# Patient Record
Sex: Male | Born: 1995 | Race: White | Hispanic: Yes | Marital: Single | Smoking: Never smoker
Health system: Southern US, Community
[De-identification: ages and names within clinical notes are randomized; demographics above are authoritative.]

## PROBLEM LIST (undated history)

## (undated) HISTORY — PX: HERNIA REPAIR: SHX51

---
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
## 2019-09-10 ENCOUNTER — Encounter (HOSPITAL_COMMUNITY): Payer: Self-pay | Admitting: Student

## 2019-09-10 ENCOUNTER — Other Ambulatory Visit: Payer: Self-pay

## 2019-09-10 ENCOUNTER — Emergency Department (HOSPITAL_COMMUNITY)
Admission: EM | Admit: 2019-09-10 | Discharge: 2019-09-10 | Disposition: A | Discharge status: Home / Self Care | Payer: Medicaid Other | Attending: Emergency Medicine | Admitting: Emergency Medicine

## 2019-09-10 ENCOUNTER — Emergency Department (HOSPITAL_COMMUNITY): Payer: Medicaid Other

## 2019-09-10 DIAGNOSIS — R112 Nausea with vomiting, unspecified: Secondary | ICD-10-CM | POA: Diagnosis not present

## 2019-09-10 DIAGNOSIS — R109 Unspecified abdominal pain: Secondary | ICD-10-CM

## 2019-09-10 DIAGNOSIS — R197 Diarrhea, unspecified: Secondary | ICD-10-CM | POA: Diagnosis not present

## 2019-09-10 DIAGNOSIS — R1013 Epigastric pain: Secondary | ICD-10-CM | POA: Diagnosis not present

## 2019-09-10 LAB — CBC
HCT: 45 % (ref 39.0–52.0)
Hemoglobin: 15.3 g/dL (ref 13.0–17.0)
MCH: 30.8 pg (ref 26.0–34.0)
MCHC: 34 g/dL (ref 30.0–36.0)
MCV: 90.7 fL (ref 80.0–100.0)
Platelets: 261 10*3/uL (ref 150–400)
RBC: 4.96 MIL/uL (ref 4.22–5.81)
RDW: 11.9 % (ref 11.5–15.5)
WBC: 6.2 10*3/uL (ref 4.0–10.5)
nRBC: 0 % (ref 0.0–0.2)

## 2019-09-10 LAB — COMPREHENSIVE METABOLIC PANEL
ALT: 20 U/L (ref 0–44)
AST: 17 U/L (ref 15–41)
Albumin: 4.2 g/dL (ref 3.5–5.0)
Alkaline Phosphatase: 67 U/L (ref 38–126)
Anion gap: 8 (ref 5–15)
BUN: 16 mg/dL (ref 6–20)
CO2: 28 mmol/L (ref 22–32)
Calcium: 9.4 mg/dL (ref 8.9–10.3)
Chloride: 105 mmol/L (ref 98–111)
Creatinine, Ser: 0.85 mg/dL (ref 0.61–1.24)
GFR calc Af Amer: >60 mL/min (ref >60)
GFR calc non Af Amer: >60 mL/min (ref >60)
Glucose, Bld: 100 mg/dL — ABNORMAL HIGH (ref 70–99)
Potassium: 4.6 mmol/L (ref 3.5–5.1)
Sodium: 141 mmol/L (ref 135–145)
Total Bilirubin: 1.1 mg/dL (ref 0.3–1.2)
Total Protein: 7.1 g/dL (ref 6.5–8.1)

## 2019-09-10 LAB — LIPASE, BLOOD: Lipase: 22 U/L (ref 11–51)

## 2019-09-10 MED ORDER — ONDANSETRON 4 MG PO TBDP
4.0000 mg | ORAL_TABLET | Freq: Once | ORAL | Status: AC
  Administered 2019-09-10: 17:00:00 4 mg via ORAL
  Filled 2019-09-10: qty 1

## 2019-09-10 MED ORDER — FAMOTIDINE 20 MG PO TABS: 20.0000 mg | ORAL_TABLET | Freq: Two times a day (BID) | ORAL | 0 refills | Status: AC

## 2019-09-10 MED ORDER — ONDANSETRON 4 MG PO TBDP: 4.0000 mg | ORAL_TABLET | Freq: Three times a day (TID) | ORAL | 0 refills | Status: AC | PRN

## 2019-09-10 MED ORDER — SUCRALFATE 1 GM/10ML PO SUSP: 1.0000 g | Freq: Three times a day (TID) | ORAL | 0 refills | Status: AC

## 2019-09-10 NOTE — ED Notes (Signed)
Pt. Ambulated to the bathroom

## 2019-09-10 NOTE — ED Notes (Signed)
Patient transported to US 

## 2019-09-10 NOTE — Discharge Instructions (Addendum)
You were seen in the emergency department today for nausea, vomiting, diarrhea, abdominal pain.  Your work-up was overall reassuring.  Your labs and ultrasound were normal.  This is potentially a viral GI illness.  We are sending you home with Zofran to take every 8 hours as needed for nausea and vomiting and Pepcid to take twice daily as needed for stomach upset as well as Carafate to take prior to meals and prior to bedtime.   We have prescribed you new medication(s) today. Discuss the medications prescribed today with your pharmacist as they can have adverse effects and interactions with your other medicines including over the counter and prescribed medications. Seek medical evaluation if you start to experience new or abnormal symptoms after taking one of these medicines, seek care immediately if you start to experience difficulty breathing, feeling of your throat closing, facial swelling, or rash as these could be indications of a more serious allergic reaction  Follow attached diet guidelines.   Follow-up with primary care within 3 days.  Return to the emergency department for new or worsening symptoms including but not limited to increased pain, blood in vomit, blood in stool, fever, or any other concerns.

## 2019-09-10 NOTE — ED Triage Notes (Signed)
Pt here for upper abdominal pain with n/v since Sunday. Pt denies sick contacts. 6 episodes of vomiting total.

## 2019-09-10 NOTE — ED Provider Notes (Signed)
MSE was initiated and I personally evaluated the patient  2:59 PM on September 10, 2019.  Patient placed in Quick Look pathway, seen and evaluated   Chief Complaint: abdominal pain, N/V  HPI:   24 yo male s/p appendectomy presents to the ED with complaints of abdominal pain & N/V/D x 3 days. 5-6 episodes of emesis, 2 episodes of loose stool- no blood in either. Intermittent upper abdominal pain, worse with eating & emesis, no alleviating factors. Unable to tolerate PO. No recent travel. No covid sick contacts. No uri sxs.   ROS: Negative for fever/chills.   Physical Exam:   Gen: No distress  Neuro: Awake and Alert  Skin: Warm    Focused Exam: Abdomen, upper abdominal tenderness most prominent in the epigastric area, RUQ > LUQ. No peritoneal signs.    Initiation of care has begun. The patient has been counseled on the process, plan, and necessity for staying for the completion/evaluation, and the remainder of the medical screening examination. I discussed with patient  and if present family/friends the importance of alerting staff to any new or worsening symptoms or any other concerns throughout his/her ER visit.    Desmond Lope 09/10/19 1501    Gerhard Munch, MD 09/11/19 [phone]

## 2019-09-10 NOTE — ED Notes (Signed)
Pt. Given some saltines and a water.

## 2019-09-10 NOTE — ED Provider Notes (Signed)
[facility] EMERGENCY DEPARTMENT Provider Note   CSN: [id number] Arrival date & time: 09/10/19  1341     History Chief Complaint  Patient presents with  . Abdominal Pain  . Emesis    Anthony Peters is a 24 y.o. male with a hx of appendectomy who presents to the ED with complaints of abdominal pain & N/V/D x 3 days. Patient reports he has had a total of about 5-6 episodes of emesis & 2 episodes of loose stools, there has not been blood present in either stool or emesis. He reports associated intermittent abdominal pain, primarily to the epigastrium, worse with trying to eat but primarily worse with emesis. No alleviating factors. He further elaborates he has been able to keep down PO fluids, just not food- attempts to eat food lead to emesis. He ate some tacos prior to onset of sxs. No specific suspicious PO intake. No recent abx. No recent foreign travel. Denies fever, chills, hematemesis, melena, hematochezia, dysuria, testicular pain/swelling, URI sxs, cough, dyspnea, or COVID exposure.   HPI     No past medical history on file.  There are no problems to display for this patient.   Past Surgical History:  Procedure Laterality Date  . HERNIA REPAIR         No family history on file.  Social History   Tobacco Use  . Smoking status: Never Smoker  Substance Use Topics  . Alcohol use: Not on file  . Drug use: Not on file    Home Medications Prior to Admission medications   Not on File    Allergies    Patient has no known allergies.  Review of Systems   Review of Systems  Constitutional: Negative for chills and fever.  HENT: Negative for congestion, ear pain and sore throat.   Respiratory: Negative for shortness of breath.   Cardiovascular: Negative for chest pain.  Gastrointestinal: Positive for abdominal pain, diarrhea, nausea and vomiting. Negative for anal bleeding, blood in stool and constipation.  Genitourinary: Negative for  dysuria, scrotal swelling and testicular pain.  Neurological: Negative for syncope.  All other systems reviewed and are negative.   Physical Exam Updated Vital Signs BP 137/87 (BP Location: Right Arm)   Pulse 64   Temp 98.1 F (36.7 C) (Oral)   Resp 16   SpO2 100%   Physical Exam Vitals and nursing note reviewed.  Constitutional:      General: He is not in acute distress.    Appearance: He is well-developed. He is not toxic-appearing.  HENT:     Head: Normocephalic and atraumatic.     Mouth/Throat:     Mouth: Mucous membranes are moist.  Eyes:     General:        Right eye: No discharge.        Left eye: No discharge.     Conjunctiva/sclera: Conjunctivae normal.  Cardiovascular:     Rate and Rhythm: Normal rate and regular rhythm.  Pulmonary:     Effort: Pulmonary effort is normal. No respiratory distress.     Breath sounds: Normal breath sounds. No wheezing, rhonchi or rales.  Abdominal:     General: There is no distension.     Palpations: Abdomen is soft.     Tenderness: There is abdominal tenderness (upper abdomen, most prominent in the epigastric area, mild to RUQ/LUQ_ (R>L)). There is no guarding or rebound. Negative signs include Murphy's sign.  Musculoskeletal:     Cervical back: Neck supple.  Skin:    General: Skin is warm and dry.     Findings: No rash.  Neurological:     Mental Status: He is alert.     Comments: Clear speech.   Psychiatric:        Behavior: Behavior normal.     ED Results / Procedures / Treatments   Labs (all labs ordered are listed, but only abnormal results are displayed) Labs Reviewed  COMPREHENSIVE METABOLIC PANEL - Abnormal; Notable for the following components:      Result Value   Glucose, Bld 100 (*)    All other components within normal limits  LIPASE, BLOOD  CBC    EKG None  Radiology US Abdomen Limited RUQ  Result Date: 09/10/2019 CLINICAL DATA:  Epigastric region pain EXAM: ULTRASOUND ABDOMEN LIMITED RIGHT UPPER  QUADRANT COMPARISON:  None. FINDINGS: Gallbladder: No gallstones or wall thickening visualized. There is no pericholecystic fluid. No sonographic Murphy sign noted by sonographer. Common bile duct: Diameter: 4 mm. No intrahepatic or extrahepatic biliary duct dilatation. Liver: No focal lesion identified. Within normal limits in parenchymal echogenicity. Portal vein is patent on color Doppler imaging with normal direction of blood flow towards the liver. Other: None. IMPRESSION: Study within normal limits. Electronically Signed   By: Lowella Grip III M.D.   On: 09/10/2019 15:43    Procedures Procedures (including critical care time)  Medications Ordered in ED Medications - No data to display  ED Course  I have reviewed the triage vital signs and the nursing notes.  Pertinent labs & imaging results that were available during my care of the patient were reviewed by me and considered in my medical decision making (see chart for details).    MDM Rules/Calculators/A&P                      Patient presents to the ED with complaints of N/V/D & abdominal pain. Patient nontoxic appearing, in no apparent distress, vitals WNL On exam patient tender to the upper abdomen, most focally in the epigastric area, no peritoneal signs.  ER work-up reviewed:  CBC: No leukocytosis/anemia.  CMP: No electrolyte derangement. LFTs & renal function WNL Lipase: WNL Imaging: RUQ [place]: Study within normal limits.   On repeat abdominal exam patient remains without peritoneal signs, doubt cholecystitis, pancreatitis, diverticulitis, appendicitis (s/p appendectomy), bowel obstruction/perforation. Doubt GI bleed without hematemesis/melena/hematochezia, anemia & BUN WNL. Patient tolerating PO in the emergency department. Will discharge home with supportive measures. I discussed results, treatment plan, need for PCP follow-up, and return precautions with the patient. Provided opportunity for questions, patient confirmed  understanding and is in agreement with plan.    Final Clinical Impression(s) / ED Diagnoses Final diagnoses:  Abdominal pain  Nausea vomiting and diarrhea    Rx / DC Orders ED Discharge Orders         Ordered    ondansetron (ZOFRAN ODT) 4 MG disintegrating tablet  Every 8 hours PRN     09/10/19 1734    famotidine (PEPCID) 20 MG tablet  2 times daily     09/10/19 1734    sucralfate (CARAFATE) 1 GM/10ML suspension  3 times daily with meals & bedtime     09/10/19 1737           Rollin Kotowski, Glynda Jaeger, PA-C 09/10/19 1750    Isla Pence, MD 09/10/19 [phone]

## 2019-09-10 NOTE — ED Notes (Signed)
Pt. States that he is feeling a lot better and is not feeling nauseas. No pain reported at this time.

## 2019-10-21 ENCOUNTER — Other Ambulatory Visit: Payer: Self-pay

## 2019-10-21 ENCOUNTER — Ambulatory Visit: Payer: Medicaid Other | Attending: Nurse Practitioner | Admitting: Nurse Practitioner

## 2019-10-30 ENCOUNTER — Ambulatory Visit: Payer: Medicaid Other | Attending: Nurse Practitioner | Admitting: Nurse Practitioner

## 2019-10-30 ENCOUNTER — Other Ambulatory Visit: Payer: Self-pay

## 2021-09-22 IMAGING — US US ABDOMEN LIMITED
1 series · 14 of 25 positions shown · non-contrast
Comparison: None.

CLINICAL DATA: Epigastric region pain

EXAM:
ULTRASOUND ABDOMEN LIMITED RIGHT UPPER QUADRANT

[Series 1: us abdomen limited · 14 of 28 slices shown]
[im 1/28]
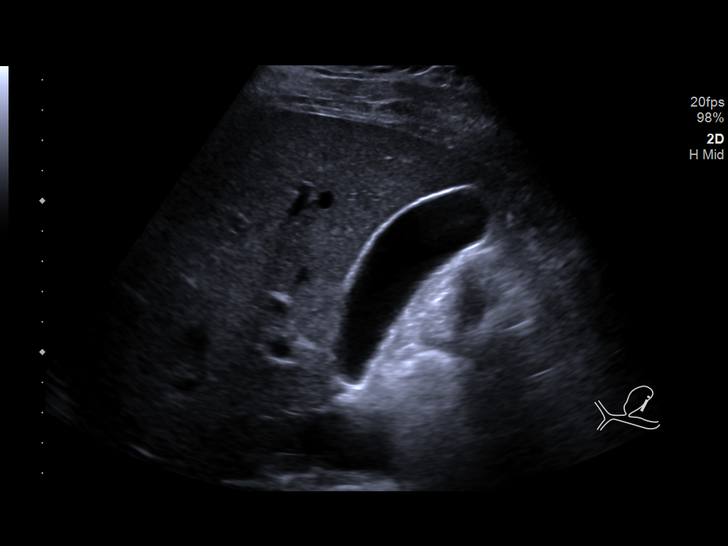
[im 3/28]
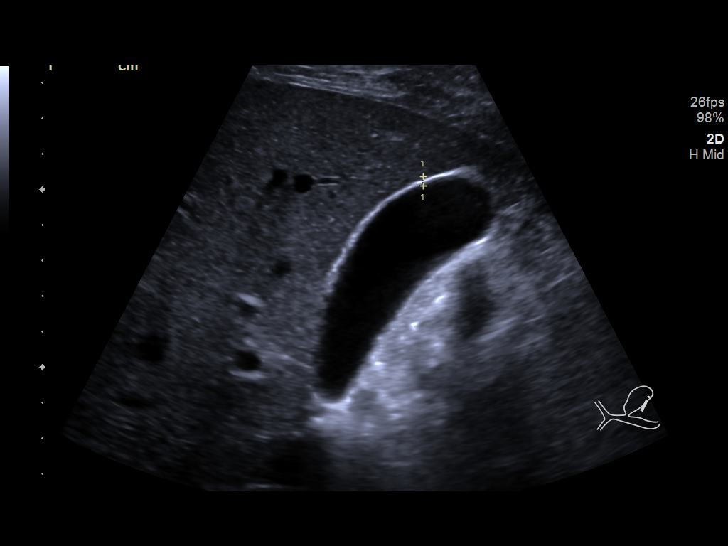
[im 5/28]
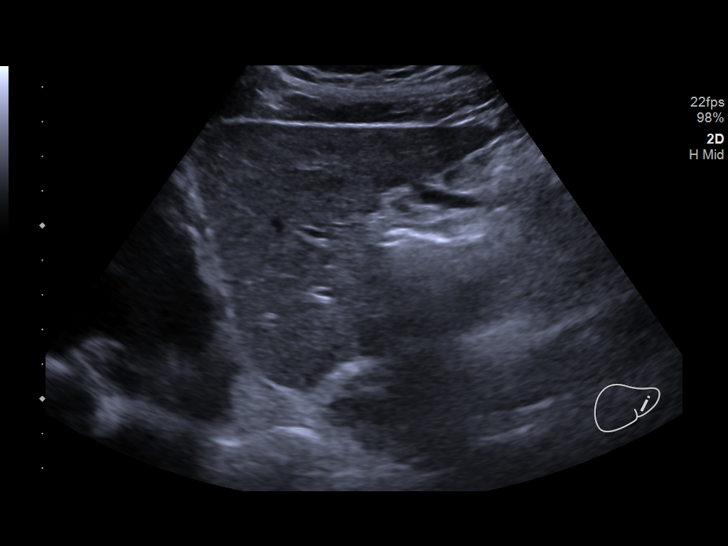
[im 7/28]
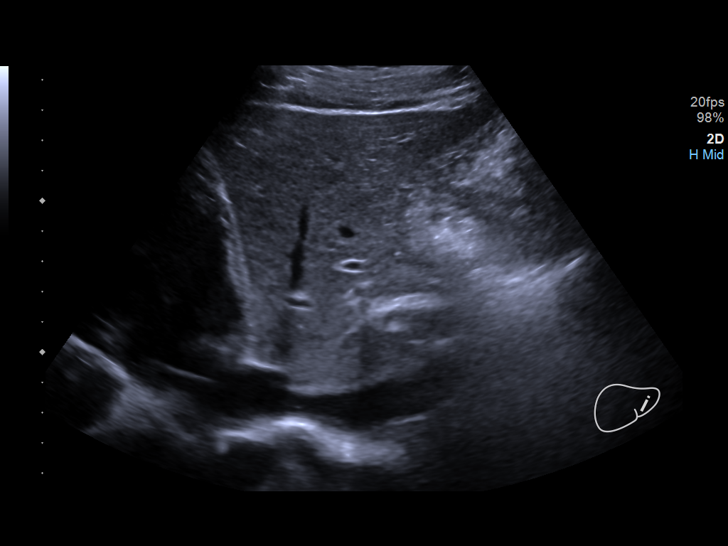
[im 10/28]
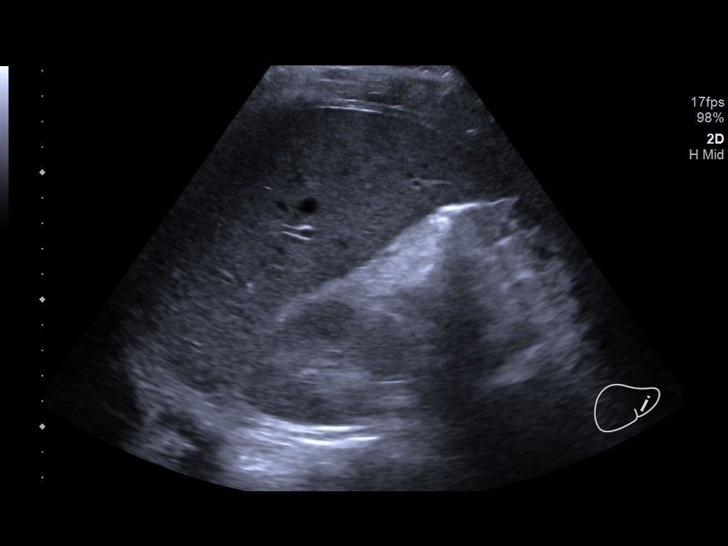
[im 11/28]
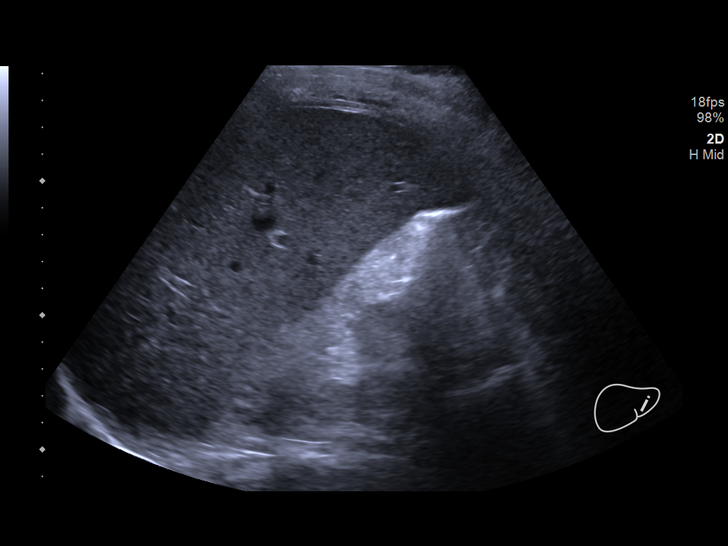
[im 13/28]
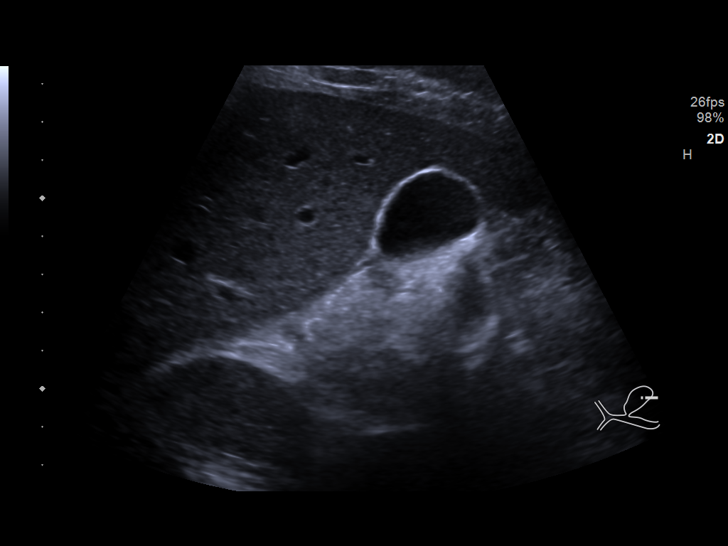
[im 15/28]
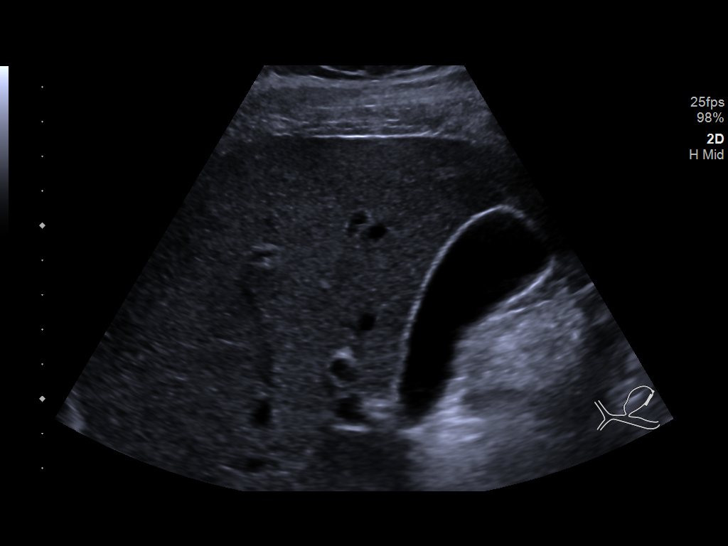
[im 17/28]
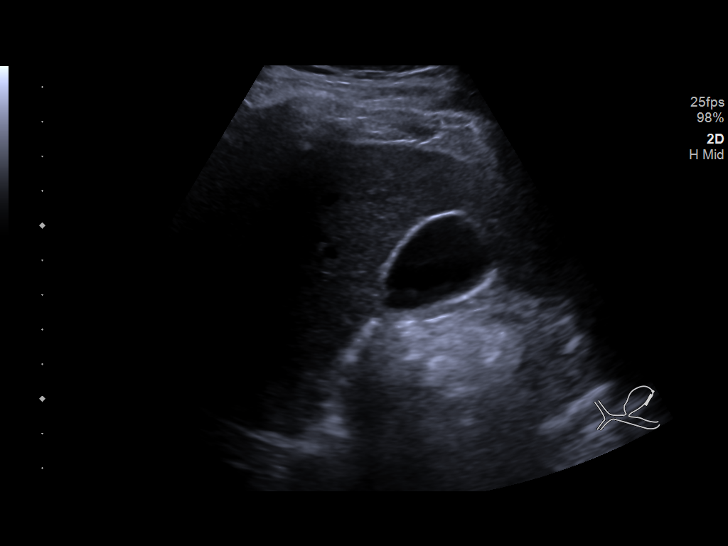
[im 19/28]
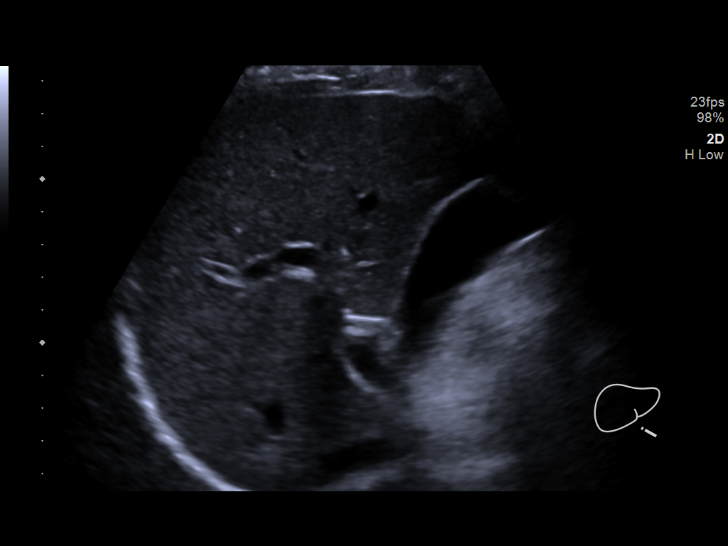
[im 21/28]
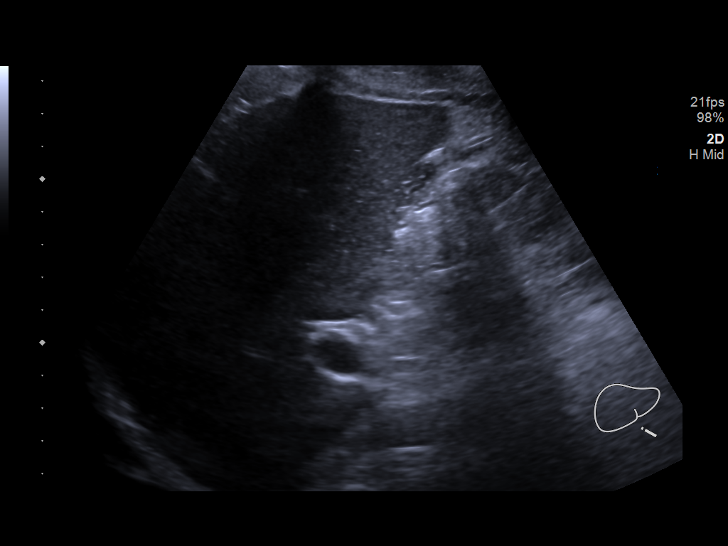
[im 23/28]
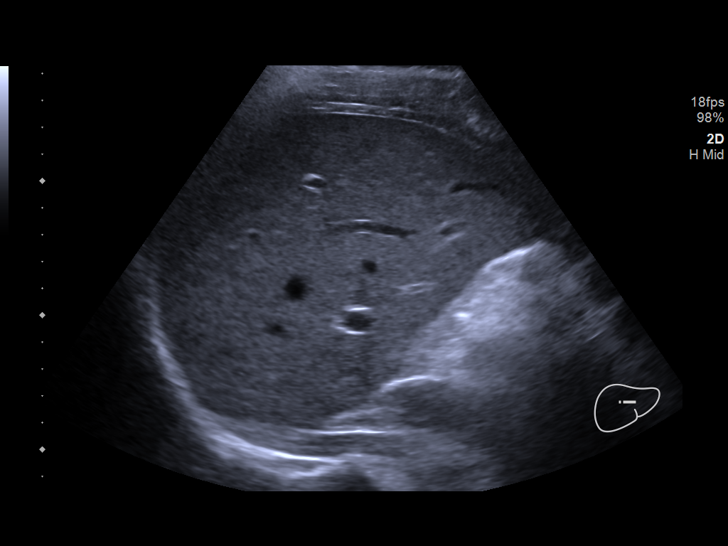
[im 25/28]
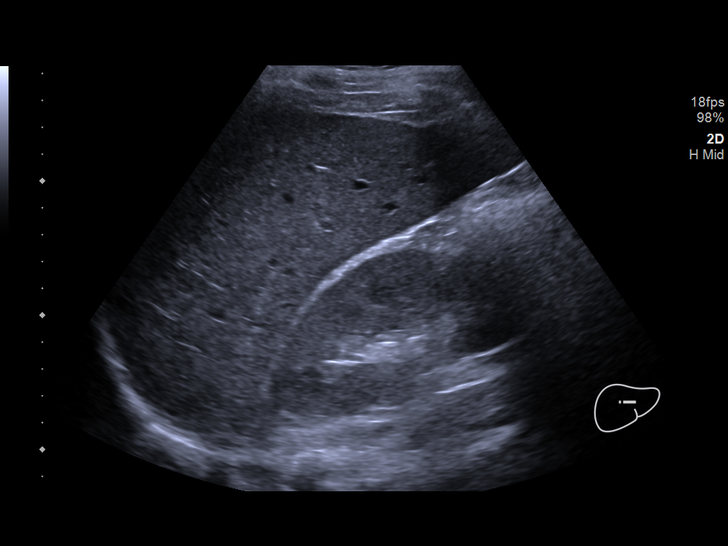
[im 28/28]
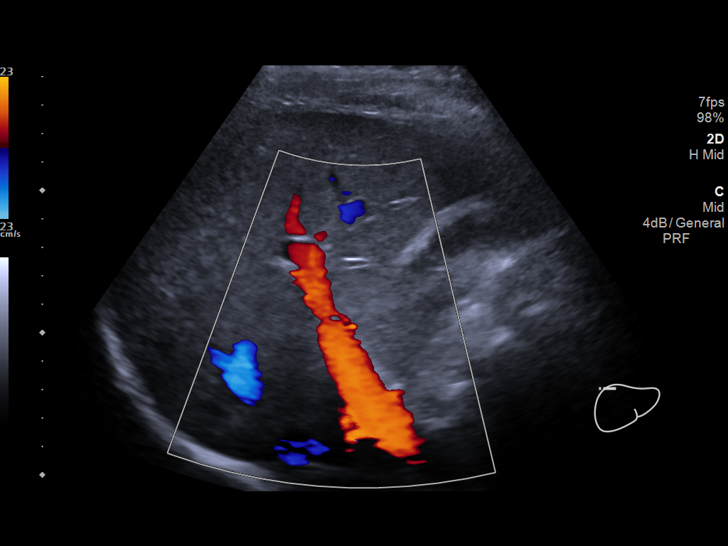

[14 of 25 positions shown; findings below may reference images not displayed]

FINDINGS: Gallbladder:

No gallstones or wall thickening visualized. There is no
pericholecystic fluid. No sonographic Murphy sign noted by
sonographer.

Common bile duct:

Diameter: 4 mm. No intrahepatic or extrahepatic biliary duct
dilatation.

Liver:

No focal lesion identified. Within normal limits in parenchymal
echogenicity. Portal vein is patent on color Doppler imaging with
normal direction of blood flow towards the liver.

Other: None.
IMPRESSION: Study within normal limits.

## 2022-09-14 DIAGNOSIS — K625 Hemorrhage of anus and rectum: Secondary | ICD-10-CM | POA: Diagnosis not present

## 2022-09-14 DIAGNOSIS — R109 Unspecified abdominal pain: Secondary | ICD-10-CM | POA: Diagnosis not present

## 2024-02-22 DIAGNOSIS — S29009A Unspecified injury of muscle and tendon of unspecified wall of thorax, initial encounter: Secondary | ICD-10-CM | POA: Diagnosis not present

## 2024-02-22 DIAGNOSIS — R0789 Other chest pain: Secondary | ICD-10-CM | POA: Diagnosis not present

## 2024-02-22 DIAGNOSIS — R9389 Abnormal findings on diagnostic imaging of other specified body structures: Secondary | ICD-10-CM | POA: Diagnosis not present

## 2024-02-22 DIAGNOSIS — R918 Other nonspecific abnormal finding of lung field: Secondary | ICD-10-CM | POA: Diagnosis not present

## 2024-02-22 DIAGNOSIS — R0989 Other specified symptoms and signs involving the circulatory and respiratory systems: Secondary | ICD-10-CM | POA: Diagnosis not present

## 2024-04-25 DIAGNOSIS — D649 Anemia, unspecified: Secondary | ICD-10-CM | POA: Diagnosis not present

## 2024-04-25 DIAGNOSIS — R079 Chest pain, unspecified: Secondary | ICD-10-CM | POA: Diagnosis not present

## 2024-04-25 DIAGNOSIS — R0789 Other chest pain: Secondary | ICD-10-CM | POA: Diagnosis not present

## 2024-08-17 ENCOUNTER — Ambulatory Visit: Admission: EM | Admit: 2024-08-17 | Discharge: 2024-08-17 | Disposition: A | Discharge status: Home / Self Care

## 2024-08-17 ENCOUNTER — Encounter: Payer: Self-pay | Admitting: Emergency Medicine

## 2024-08-17 ENCOUNTER — Emergency Department (HOSPITAL_COMMUNITY): Admission: EM | Admit: 2024-08-17 | Discharge: 2024-08-17 | Discharge status: Against Medical Advice

## 2024-08-17 DIAGNOSIS — R1013 Epigastric pain: Secondary | ICD-10-CM | POA: Diagnosis not present

## 2024-08-17 NOTE — ED Provider Notes (Signed)
 [facility]    CSN: [id number] Arrival date & time: 08/17/24  1313      History   Chief Complaint Chief Complaint  Patient presents with   Abdominal Pain    HPI Anthony Peters is a 28 y.o. male.   Patient presents today due to new onset epigastric abdominal pain that started today.  Patient ranks his pain an 8/10.  Patient also had 1 episode of vomiting today as well.  Patient does admit to current nausea as well.  Patient also reports that for the last month he has had episodes of copious bright red blood per rectum.  Patient states that he notices blood clots in the entire toilet is full of blood.  Patient denies use of any medications for his symptoms or being evaluated for symptoms by anybody else in this past month.  The history is provided by the patient.  Abdominal Pain   History reviewed. No pertinent past medical history.  There are no active problems to display for this patient.   Past Surgical History:  Procedure Laterality Date   HERNIA REPAIR         Home Medications    Prior to Admission medications  Medication Sig Start Date End Date Taking? Authorizing Provider  famotidine  (PEPCID ) 20 MG tablet Take 1 tablet (20 mg total) by mouth 2 (two) times daily. 09/10/19   Petrucelli, Samantha R, PA-C  ondansetron  (ZOFRAN  ODT) 4 MG disintegrating tablet Take 1 tablet (4 mg total) by mouth every 8 (eight) hours as needed for nausea or vomiting. 09/10/19   Petrucelli, Samantha R, PA-C  sucralfate  (CARAFATE ) 1 GM/10ML suspension Take 10 mLs (1 g total) by mouth 4 (four) times daily -  with meals and at bedtime. 09/10/19   Petrucelli, Lucie SAUNDERS, PA-C    Family History History reviewed. No pertinent family history.  Social History Social History[1]   Allergies   Patient has no known allergies.   Review of Systems Review of Systems  Gastrointestinal:  Positive for abdominal pain.     Physical Exam Triage Vital Signs ED Triage Vitals   Encounter Vitals Group     BP 08/17/24 1328 (!) 141/94     Girls Systolic BP Percentile --      Girls Diastolic BP Percentile --      Boys Systolic BP Percentile --      Boys Diastolic BP Percentile --      Pulse Rate 08/17/24 1328 100     Resp 08/17/24 1328 18     Temp 08/17/24 1328 98.3 F (36.8 C)     Temp Source 08/17/24 1328 Oral     SpO2 08/17/24 1328 98 %     Weight 08/17/24 1327 220 lb (99.8 kg)     Height --      Head Circumference --      Peak Flow --      Pain Score 08/17/24 1326 9     Pain Loc --      Pain Education --      Exclude from Growth Chart --    No data found.  Updated Vital Signs BP (!) 141/94 (BP Location: Right Arm)   Pulse 100   Temp 98.3 F (36.8 C) (Oral)   Resp 18   Wt 220 lb (99.8 kg)   SpO2 98%   Visual Acuity Right Eye Distance:   Left Eye Distance:   Bilateral Distance:    Right Eye Near:   Left Eye Near:  Bilateral Near:     Physical Exam Vitals and nursing note reviewed.  Constitutional:      General: He is not in acute distress.    Appearance: He is well-developed. He is not ill-appearing, toxic-appearing or diaphoretic.  Eyes:     General: No scleral icterus. Cardiovascular:     Rate and Rhythm: Normal rate and regular rhythm.     Heart sounds: Normal heart sounds.  Pulmonary:     Effort: Pulmonary effort is normal. No respiratory distress.     Breath sounds: Normal breath sounds. No wheezing or rhonchi.  Abdominal:     General: Abdomen is flat. Bowel sounds are normal.     Palpations: Abdomen is soft.     Tenderness: There is abdominal tenderness in the right upper quadrant, epigastric area, left upper quadrant and left lower quadrant. There is no right CVA tenderness or left CVA tenderness.     Comments: Marked tenderness to palpation of epigastrium, no abdominal bruit audible but thrill noted due to abdominal aorta  Skin:    General: Skin is warm.  Neurological:     Mental Status: He is alert and oriented to  person, place, and time.  Psychiatric:        Mood and Affect: Mood normal.        Behavior: Behavior normal.      UC Treatments / Results  Labs (all labs ordered are listed, but only abnormal results are displayed) Labs Reviewed - No data to display  EKG   Radiology No results found.  Procedures Procedures (including critical care time)  Medications Ordered in UC Medications - No data to display  Initial Impression / Assessment and Plan / UC Course  I have reviewed the triage vital signs and the nursing notes.  Pertinent labs & imaging results that were available during my care of the patient were reviewed by me and considered in my medical decision making (see chart for details).      Final Clinical Impressions(s) / UC Diagnoses   Final diagnoses:  Abdominal pain, epigastric     Discharge Instructions      Please go to the ER for further evaluation of your abdominal pain    ED Prescriptions   None    PDMP not reviewed this encounter.    [1]  Social History Tobacco Use   Smoking status: Never    Passive exposure: Never   Smokeless tobacco: Never  Vaping Use   Vaping status: Never Used  Substance Use Topics   Alcohol use: Not Currently   Drug use: Not Currently     Andra Corean BROCKS, PA-C 08/17/24 1400

## 2024-08-17 NOTE — ED Notes (Signed)
Called for vitals no response

## 2024-08-17 NOTE — Discharge Instructions (Signed)
 Please go to the ER for further evaluation of your abdominal pain

## 2024-08-17 NOTE — ED Triage Notes (Addendum)
 Pt presents with Litzi, girlfriend of the pt.  Pt presents c/o abd pain x today. Pt states,  I am having sharp pains in the middle of my stomach. It started today but the last couple days I have been pooping blood.  Pt denies any additional sxs.
